# Patient Record
Sex: Male | Born: 1960 | Race: Black or African American | Hispanic: No | Marital: Single | Smoking: Current every day smoker
Health system: Southern US, Community
[De-identification: ages and names within clinical notes are randomized; demographics above are authoritative.]

## PROBLEM LIST (undated history)

## (undated) DIAGNOSIS — I219 Acute myocardial infarction, unspecified: Secondary | ICD-10-CM

## (undated) DIAGNOSIS — E119 Type 2 diabetes mellitus without complications: Secondary | ICD-10-CM

## (undated) DIAGNOSIS — I10 Essential (primary) hypertension: Secondary | ICD-10-CM

---

## 2004-01-27 ENCOUNTER — Emergency Department (HOSPITAL_COMMUNITY): Admission: EM | Admit: 2004-01-27 | Discharge: 2004-01-27 | Payer: Self-pay | Admitting: Emergency Medicine
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
## 2019-11-06 ENCOUNTER — Other Ambulatory Visit: Payer: Self-pay

## 2019-11-06 ENCOUNTER — Encounter (HOSPITAL_COMMUNITY): Payer: Self-pay | Admitting: Emergency Medicine

## 2019-11-06 ENCOUNTER — Emergency Department (HOSPITAL_COMMUNITY): Payer: No Typology Code available for payment source

## 2019-11-06 ENCOUNTER — Emergency Department (HOSPITAL_COMMUNITY)
Admission: EM | Admit: 2019-11-06 | Discharge: 2019-11-06 | Disposition: A | Discharge status: Home / Self Care | Payer: No Typology Code available for payment source | Attending: Emergency Medicine | Admitting: Emergency Medicine

## 2019-11-06 DIAGNOSIS — Y929 Unspecified place or not applicable: Secondary | ICD-10-CM | POA: Diagnosis not present

## 2019-11-06 DIAGNOSIS — S39012A Strain of muscle, fascia and tendon of lower back, initial encounter: Secondary | ICD-10-CM | POA: Insufficient documentation

## 2019-11-06 DIAGNOSIS — E119 Type 2 diabetes mellitus without complications: Secondary | ICD-10-CM | POA: Insufficient documentation

## 2019-11-06 DIAGNOSIS — M25511 Pain in right shoulder: Secondary | ICD-10-CM | POA: Insufficient documentation

## 2019-11-06 DIAGNOSIS — Y9389 Activity, other specified: Secondary | ICD-10-CM | POA: Diagnosis not present

## 2019-11-06 DIAGNOSIS — F17200 Nicotine dependence, unspecified, uncomplicated: Secondary | ICD-10-CM | POA: Insufficient documentation

## 2019-11-06 DIAGNOSIS — M79651 Pain in right thigh: Secondary | ICD-10-CM | POA: Diagnosis not present

## 2019-11-06 DIAGNOSIS — Y998 Other external cause status: Secondary | ICD-10-CM | POA: Diagnosis not present

## 2019-11-06 DIAGNOSIS — I10 Essential (primary) hypertension: Secondary | ICD-10-CM | POA: Insufficient documentation

## 2019-11-06 DIAGNOSIS — S3992XA Unspecified injury of lower back, initial encounter: Secondary | ICD-10-CM | POA: Diagnosis present

## 2019-11-06 HISTORY — DX: Type 2 diabetes mellitus without complications: E11.9

## 2019-11-06 HISTORY — DX: Essential (primary) hypertension: I10

## 2019-11-06 MED ORDER — ACETAMINOPHEN 500 MG PO TABS
1000.0000 mg | ORAL_TABLET | Freq: Once | ORAL | Status: AC
  Administered 2019-11-06: 1000 mg via ORAL

## 2019-11-06 MED ORDER — CYCLOBENZAPRINE HCL 10 MG PO TABS: 10.0000 mg | ORAL_TABLET | Freq: Two times a day (BID) | ORAL | 0 refills | Status: AC | PRN

## 2019-11-06 NOTE — Discharge Instructions (Signed)
Use heat and Flexeril as needed for muscle spasms. Continue Tylenol and ibuprofen as needed for pain. Return for new or worsening symptoms.
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
## 2020-06-28 ENCOUNTER — Emergency Department (HOSPITAL_COMMUNITY)
Admission: EM | Admit: 2020-06-28 | Discharge: 2020-06-28 | Disposition: A | Discharge status: Home / Self Care | Payer: Self-pay | Attending: Emergency Medicine | Admitting: Emergency Medicine

## 2020-06-28 ENCOUNTER — Encounter (HOSPITAL_COMMUNITY): Payer: Self-pay | Admitting: Emergency Medicine

## 2020-06-28 DIAGNOSIS — R4182 Altered mental status, unspecified: Secondary | ICD-10-CM | POA: Insufficient documentation

## 2020-06-28 DIAGNOSIS — Z5321 Procedure and treatment not carried out due to patient leaving prior to being seen by health care provider: Secondary | ICD-10-CM | POA: Insufficient documentation

## 2020-06-28 LAB — CBG MONITORING, ED: Glucose-Capillary: 269 mg/dL — ABNORMAL HIGH (ref 70–99)

## 2020-06-28 NOTE — ED Notes (Signed)
Refusing to have lab draw on triage.

## 2020-06-28 NOTE — ED Notes (Signed)
Pt now alert and oriented x 4, talking on phone with friend who he is asking to come pick him up.  Pt verbalizes understanding of risks associated with leaving prior to being evaluated by MD. Pt verbalizes understanding. IV removed and pt ambulatory out of dept.

## 2020-06-28 NOTE — ED Triage Notes (Signed)
[place] [facility] [place] side [place] where [facility] called due to patient acting altered, cbg was 241, ems reports pt ate some food and then started to become more drowsy and altered for ems. Pt did admit to ETOH. EMS GCS 6, 92-93% on room air, last cbg 268, hr 83, rr 18, 18G IV L hand.

## 2020-08-10 ENCOUNTER — Emergency Department (HOSPITAL_COMMUNITY)
Admission: EM | Admit: 2020-08-10 | Discharge: 2020-08-10 | Disposition: A | Discharge status: Home / Self Care | Payer: Self-pay | Attending: Emergency Medicine | Admitting: Emergency Medicine

## 2020-08-10 ENCOUNTER — Encounter (HOSPITAL_COMMUNITY): Payer: Self-pay | Admitting: Emergency Medicine

## 2020-08-10 DIAGNOSIS — R112 Nausea with vomiting, unspecified: Secondary | ICD-10-CM | POA: Insufficient documentation

## 2020-08-10 DIAGNOSIS — R42 Dizziness and giddiness: Secondary | ICD-10-CM

## 2020-08-10 DIAGNOSIS — Z5321 Procedure and treatment not carried out due to patient leaving prior to being seen by health care provider: Secondary | ICD-10-CM | POA: Insufficient documentation

## 2020-08-10 LAB — BASIC METABOLIC PANEL
Anion gap: 10 (ref 5–15)
BUN: 11 mg/dL (ref 6–20)
CO2: 22 mmol/L (ref 22–32)
Calcium: 9.1 mg/dL (ref 8.9–10.3)
Chloride: 106 mmol/L (ref 98–111)
Creatinine, Ser: 0.94 mg/dL (ref 0.61–1.24)
GFR, Estimated: >60 mL/min (ref >60)
Glucose, Bld: 285 mg/dL — ABNORMAL HIGH (ref 70–99)
Potassium: 4.2 mmol/L (ref 3.5–5.1)
Sodium: 138 mmol/L (ref 135–145)

## 2020-08-10 LAB — CBC
HCT: 47.6 % (ref 39.0–52.0)
Hemoglobin: 15.5 g/dL (ref 13.0–17.0)
MCH: 32.2 pg (ref 26.0–34.0)
MCHC: 32.6 g/dL (ref 30.0–36.0)
MCV: 98.8 fL (ref 80.0–100.0)
Platelets: 194 10*3/uL (ref 150–400)
RBC: 4.82 MIL/uL (ref 4.22–5.81)
RDW: 12.1 % (ref 11.5–15.5)
WBC: 7.2 10*3/uL (ref 4.0–10.5)
nRBC: 0 % (ref 0.0–0.2)

## 2020-08-10 LAB — CBG MONITORING, ED: Glucose-Capillary: 279 mg/dL — ABNORMAL HIGH (ref 70–99)

## 2020-08-10 NOTE — ED Notes (Signed)
Pt did not respond in the waiting room. Unable to find pt in triage, waiting room, bathroom etc.

## 2020-08-10 NOTE — ED Triage Notes (Signed)
Pt arrives to ED via [facility]- pt states he was walking from [place] to the [place] when around 9am he began to feel like the world was spinning this made him nausea and caused him to vomit a few times. Someone who seen this called ems. Pt states by the time ems arrived he felt much better no dizziness at this time. Pt has clear speech no arm or leg drift, clear speech. Pt also reports drinking liquor last night and does normally drink beer daily.

## 2020-08-10 NOTE — ED Notes (Signed)
Assumed pt eloped not able to find anywhere in [place]

## 2020-11-27 ENCOUNTER — Emergency Department (HOSPITAL_COMMUNITY)
Admission: EM | Admit: 2020-11-27 | Discharge: 2020-11-27 | Disposition: A | Discharge status: Home / Self Care | Payer: HRSA Program | Attending: Emergency Medicine | Admitting: Emergency Medicine

## 2020-11-27 ENCOUNTER — Other Ambulatory Visit: Payer: Self-pay

## 2020-11-27 DIAGNOSIS — R112 Nausea with vomiting, unspecified: Secondary | ICD-10-CM | POA: Insufficient documentation

## 2020-11-27 DIAGNOSIS — I10 Essential (primary) hypertension: Secondary | ICD-10-CM | POA: Insufficient documentation

## 2020-11-27 DIAGNOSIS — E1165 Type 2 diabetes mellitus with hyperglycemia: Secondary | ICD-10-CM | POA: Diagnosis not present

## 2020-11-27 DIAGNOSIS — R1033 Periumbilical pain: Secondary | ICD-10-CM | POA: Diagnosis not present

## 2020-11-27 DIAGNOSIS — R059 Cough, unspecified: Secondary | ICD-10-CM | POA: Diagnosis present

## 2020-11-27 DIAGNOSIS — F17200 Nicotine dependence, unspecified, uncomplicated: Secondary | ICD-10-CM | POA: Diagnosis not present

## 2020-11-27 DIAGNOSIS — U071 COVID-19: Secondary | ICD-10-CM | POA: Diagnosis not present

## 2020-11-27 DIAGNOSIS — R739 Hyperglycemia, unspecified: Secondary | ICD-10-CM

## 2020-11-27 LAB — CBC
HCT: 47.2 % (ref 39.0–52.0)
Hemoglobin: 16.1 g/dL (ref 13.0–17.0)
MCH: 32.1 pg (ref 26.0–34.0)
MCHC: 34.1 g/dL (ref 30.0–36.0)
MCV: 94 fL (ref 80.0–100.0)
Platelets: 239 10*3/uL (ref 150–400)
RBC: 5.02 MIL/uL (ref 4.22–5.81)
RDW: 12.1 % (ref 11.5–15.5)
WBC: 5.1 10*3/uL (ref 4.0–10.5)
nRBC: 0 % (ref 0.0–0.2)

## 2020-11-27 LAB — URINALYSIS, ROUTINE W REFLEX MICROSCOPIC
Bilirubin Urine: NEGATIVE
Glucose, UA: >=500 mg/dL — AB
Hgb urine dipstick: NEGATIVE
Ketones, ur: 20 mg/dL — AB
Leukocytes,Ua: NEGATIVE
Nitrite: NEGATIVE
Protein, ur: NEGATIVE mg/dL
Specific Gravity, Urine: 1.036 — ABNORMAL HIGH (ref 1.005–1.030)
pH: 5 (ref 5.0–8.0)

## 2020-11-27 LAB — COMPREHENSIVE METABOLIC PANEL
ALT: 31 U/L (ref 0–44)
AST: 22 U/L (ref 15–41)
Albumin: 3 g/dL — ABNORMAL LOW (ref 3.5–5.0)
Alkaline Phosphatase: 74 U/L (ref 38–126)
Anion gap: 11 (ref 5–15)
BUN: 10 mg/dL (ref 6–20)
CO2: 23 mmol/L (ref 22–32)
Calcium: 9.2 mg/dL (ref 8.9–10.3)
Chloride: 102 mmol/L (ref 98–111)
Creatinine, Ser: 0.92 mg/dL (ref 0.61–1.24)
GFR, Estimated: >60 mL/min (ref >60)
Glucose, Bld: 311 mg/dL — ABNORMAL HIGH (ref 70–99)
Potassium: 4 mmol/L (ref 3.5–5.1)
Sodium: 136 mmol/L (ref 135–145)
Total Bilirubin: 0.5 mg/dL (ref 0.3–1.2)
Total Protein: 6 g/dL — ABNORMAL LOW (ref 6.5–8.1)

## 2020-11-27 LAB — CBG MONITORING, ED: Glucose-Capillary: 300 mg/dL — ABNORMAL HIGH (ref 70–99)

## 2020-11-27 LAB — POC SARS CORONAVIRUS 2 AG -  ED: SARS Coronavirus 2 Ag: POSITIVE — AB

## 2020-11-27 LAB — LIPASE, BLOOD: Lipase: 48 U/L (ref 11–51)

## 2020-11-27 MED ORDER — INSULIN ASPART 100 UNIT/ML ~~LOC~~ SOLN
5.0000 [IU] | Freq: Once | SUBCUTANEOUS | Status: AC
  Administered 2020-11-27: 5 [IU] via SUBCUTANEOUS

## 2020-11-27 MED ORDER — IBUPROFEN 400 MG PO TABS
600.0000 mg | ORAL_TABLET | Freq: Once | ORAL | Status: AC
  Administered 2020-11-27: 600 mg via ORAL
  Filled 2020-11-27: qty 1

## 2020-11-27 MED ORDER — METFORMIN HCL 500 MG PO TABS
500.0000 mg | ORAL_TABLET | Freq: Once | ORAL | Status: AC
  Administered 2020-11-27: 500 mg via ORAL
  Filled 2020-11-27: qty 1

## 2020-11-27 MED ORDER — ONDANSETRON 4 MG PO TBDP: 4.0000 mg | ORAL_TABLET | Freq: Three times a day (TID) | ORAL | 0 refills | Status: AC | PRN

## 2020-11-27 MED ORDER — ONDANSETRON 4 MG PO TBDP
4.0000 mg | ORAL_TABLET | Freq: Once | ORAL | Status: AC
  Administered 2020-11-27: 4 mg via ORAL
  Filled 2020-11-27: qty 1

## 2020-11-27 NOTE — ED Notes (Signed)
Date and time results received: 11/27/20  Test: COVID  Critical Value: Positive  Name of Provider Notified: Tresa Endo PA

## 2020-11-27 NOTE — ED Triage Notes (Signed)
Pt reports two weeks of central abdominal pain, n/v/d.

## 2020-11-27 NOTE — Discharge Instructions (Signed)
You have tested positive for COVID-19 today. Take tylenol for fever, headaches, body aches. Drink plenty of fluids to prevent dehydration and use Zofran to manage persistent nausea. Remain compliant with your metformin and insulin. You may continue to use other over-the-counter remedies for symptom control, if desired. Return for new or concerning symptoms such as worsening shortness of breath, coughing up blood, persistent vomiting, loss of consciousness.

## 2020-11-27 NOTE — ED Notes (Signed)
Pt's CBG result was 300. Informed Morrie Sheldon - RN.

## 2020-11-27 NOTE — ED Provider Notes (Signed)
[facility] EMERGENCY DEPARTMENT Provider Note   CSN: [id number] Arrival date & time: 11/27/20  1448     History Chief Complaint  Patient presents with  . Abdominal Pain  . Vomiting  . Diarrhea    John Mayo is a 60 y.o. male.  60 year old male with hx of HTN, IDDM presents to the emergency department for 1 week of flulike symptoms. States that he has been experiencing generalized fatigue and malaise with cough and nasal congestion. Has developed nausea and vomiting with difficulty tolerating food and fluids; however, has only experienced one episode of vomiting in the past 24 hours. Does endorse some of his emesis to be posttussive. Has been trying over-the-counter medications, such as Alka-Seltzer and Tylenol, for symptomatic improvement without significant relief. Triage note references abdominal pain, though patient reporting only minimal waxing and waning periumbilical discomfort. He has no history of abdominal surgeries. Has been poorly compliant with his insulin due to lack of appetite. No polyuria or polydipsia. Not vaccinated for COVID.        Past Medical History:  Diagnosis Date  . Diabetes mellitus without complication ([place])   . Hypertension     There are no problems to display for this patient.   No past surgical history on file.     No family history on file.  Social History   Tobacco Use  . Smoking status: Current Every Day Smoker  . Smokeless tobacco: Never Used  Substance Use Topics  . Alcohol use: Yes  . Drug use: Not Currently    Home Medications Prior to Admission medications   Medication Sig Start Date End Date Taking? Authorizing Provider  ondansetron (ZOFRAN ODT) 4 MG disintegrating tablet Take 1 tablet (4 mg total) by mouth every 8 (eight) hours as needed for nausea or vomiting. 11/27/20  Yes Antonietta Breach, PA-C  cyclobenzaprine (FLEXERIL) 10 MG tablet Take 1 tablet (10 mg total) by mouth 2 (two) times daily as needed  for muscle spasms. 11/06/19   Elnora Morrison, MD    Allergies    Patient has no known allergies.  Review of Systems   Review of Systems  Constitutional: Positive for appetite change, chills and fever (subjective).  Respiratory: Positive for cough. Negative for shortness of breath.   Gastrointestinal: Positive for diarrhea and vomiting.  Musculoskeletal: Positive for myalgias.  Neurological: Negative for syncope.  Ten systems reviewed and are negative for acute change, except as noted in the HPI.    Physical Exam Updated Vital Signs BP 129/75   Pulse 65   Temp 99.2 F (37.3 C) (Oral)   Resp 16   Ht 5' 10"  (1.778 m)   Wt 98 kg   SpO2 100%   BMI 30.99 kg/m   Physical Exam Vitals and nursing note reviewed.  Constitutional:      General: He is not in acute distress.    Appearance: He is well-developed and well-nourished. He is not diaphoretic.     Comments: Appears fatigued, but nontoxic. Patient in no acute distress.  HENT:     Head: Normocephalic and atraumatic.     Nose: Congestion present.  Eyes:     General: No scleral icterus.    Extraocular Movements: EOM normal.     Conjunctiva/sclera: Conjunctivae normal.  Cardiovascular:     Rate and Rhythm: Normal rate and regular rhythm.     Pulses: Normal pulses.  Pulmonary:     Effort: Pulmonary effort is normal. No respiratory distress.     Breath  sounds: No stridor. No wheezing.     Comments: Sporadic, dry cough. Respirations even and unlabored. Sats 96% on room air. Abdominal:     General: There is no distension.     Palpations: Abdomen is soft.     Tenderness: There is no abdominal tenderness.     Comments: Soft, nontender abdomen. No masses, peritoneal signs.  Musculoskeletal:        General: Normal range of motion.     Cervical back: Normal range of motion.  Skin:    General: Skin is warm and dry.     Coloration: Skin is not pale.     Findings: No erythema or rash.  Neurological:     Mental Status: He is  alert and oriented to person, place, and time.     Coordination: Coordination normal.  Psychiatric:        Mood and Affect: Mood and affect normal.        Behavior: Behavior normal.     ED Results / Procedures / Treatments   Labs (all labs ordered are listed, but only abnormal results are displayed) Labs Reviewed  COMPREHENSIVE METABOLIC PANEL - Abnormal; Notable for the following components:      Result Value   Glucose, Bld 311 (*)    Total Protein 6.0 (*)    Albumin 3.0 (*)    All other components within normal limits  URINALYSIS, ROUTINE W REFLEX MICROSCOPIC - Abnormal; Notable for the following components:   Specific Gravity, Urine 1.036 (*)    Glucose, UA >=500 (*)    Ketones, ur 20 (*)    Bacteria, UA RARE (*)    All other components within normal limits  POC SARS CORONAVIRUS 2 AG -  ED - Abnormal; Notable for the following components:   SARS Coronavirus 2 Ag POSITIVE (*)    All other components within normal limits  LIPASE, BLOOD  CBC  CBG MONITORING, ED    EKG None  Radiology No results found.  Procedures Procedures   Medications Ordered in ED Medications  ondansetron (ZOFRAN-ODT) disintegrating tablet 4 mg (4 mg Oral Given 11/27/20 1121)  ibuprofen (ADVIL) tablet 600 mg (600 mg Oral Given 11/27/20 1121)  metFORMIN (GLUCOPHAGE) tablet 500 mg (500 mg Oral Given 11/27/20 1121)  insulin aspart (novoLOG) injection 5 Units (5 Units Subcutaneous Given 11/27/20 1149)    ED Course  I have reviewed the triage vital signs and the nursing notes.  Pertinent labs & imaging results that were available during my care of the patient were reviewed by me and considered in my medical decision making (see chart for details).    MDM Rules/Calculators/A&P                          60 year old male presenting to the emergency department for a myriad of symptoms including generalized fatigue, malaise, myalgias, cough, nausea and vomiting.  He tested positive for COVID-19 in the  ED today.  He is not vaccinated.  Has no shortness of breath, tachypnea, dyspnea, hypoxia.  Is afebrile with reassuring laboratory evaluation.  No vomiting since arrival to the ED.  Was given Zofran and has been able to tolerate oral medications.  Will discharge with instructions for continued outpatient supportive care.  Short course of antiemetics prescribed.  Return precautions discussed and provided. Patient discharged in stable condition with no unaddressed concerns.  Minas Bonser was evaluated in Emergency Department on 11/27/2020 for the symptoms described in the history of  present illness. He was evaluated in the context of the global COVID-19 pandemic, which necessitated consideration that the patient might be at risk for infection with the SARS-CoV-2 virus that causes COVID-19. Institutional protocols and algorithms that pertain to the evaluation of patients at risk for COVID-19 are in a state of rapid change based on information released by regulatory bodies including the CDC and federal and state organizations. These policies and algorithms were followed during the patient's care in the ED.   Final Clinical Impression(s) / ED Diagnoses Final diagnoses:  [id number] virus infection  Hyperglycemia    Rx / DC Orders ED Discharge Orders         Ordered    ondansetron (ZOFRAN ODT) 4 MG disintegrating tablet  Every 8 hours PRN        11/27/20 1237    Ambulatory referral for Covid Treatment        11/27/20 1237           Antonietta Breach, Hershal Coria 11/27/20 1245    Truddie Hidden, MD 11/27/20 1329

## 2020-11-27 NOTE — ED Notes (Signed)
Pt d/c home per MD order. Tresa Endo PA made aware of pt CBG. Discharge summary reviewed with pt, pt verbalizes understanding. Ambulatory off unit. No s/s of acute distress noted at discharge.
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
## 2020-12-30 ENCOUNTER — Encounter (HOSPITAL_COMMUNITY): Payer: Self-pay

## 2020-12-30 ENCOUNTER — Emergency Department (HOSPITAL_COMMUNITY)
Admission: EM | Admit: 2020-12-30 | Discharge: 2020-12-30 | Disposition: A | Discharge status: Home / Self Care | Payer: Self-pay | Attending: Emergency Medicine | Admitting: Emergency Medicine

## 2020-12-30 DIAGNOSIS — R42 Dizziness and giddiness: Secondary | ICD-10-CM | POA: Insufficient documentation

## 2020-12-30 DIAGNOSIS — I959 Hypotension, unspecified: Secondary | ICD-10-CM | POA: Insufficient documentation

## 2020-12-30 DIAGNOSIS — E119 Type 2 diabetes mellitus without complications: Secondary | ICD-10-CM | POA: Insufficient documentation

## 2020-12-30 DIAGNOSIS — I10 Essential (primary) hypertension: Secondary | ICD-10-CM | POA: Insufficient documentation

## 2020-12-30 DIAGNOSIS — R55 Syncope and collapse: Secondary | ICD-10-CM | POA: Insufficient documentation

## 2020-12-30 DIAGNOSIS — F17200 Nicotine dependence, unspecified, uncomplicated: Secondary | ICD-10-CM | POA: Insufficient documentation

## 2020-12-30 LAB — CBC WITH DIFFERENTIAL/PLATELET
Abs Immature Granulocytes: 0.1 10*3/uL — ABNORMAL HIGH (ref 0.00–0.07)
Basophils Absolute: 0 10*3/uL (ref 0.0–0.1)
Basophils Relative: 0 %
Eosinophils Absolute: 0.1 10*3/uL (ref 0.0–0.5)
Eosinophils Relative: 1 %
HCT: 55 % — ABNORMAL HIGH (ref 39.0–52.0)
Hemoglobin: 18.3 g/dL — ABNORMAL HIGH (ref 13.0–17.0)
Immature Granulocytes: 1 %
Lymphocytes Relative: 10 %
Lymphs Abs: 1.5 10*3/uL (ref 0.7–4.0)
MCH: 31.2 pg (ref 26.0–34.0)
MCHC: 33.3 g/dL (ref 30.0–36.0)
MCV: 93.7 fL (ref 80.0–100.0)
Monocytes Absolute: 0.8 10*3/uL (ref 0.1–1.0)
Monocytes Relative: 5 %
Neutro Abs: 11.9 10*3/uL — ABNORMAL HIGH (ref 1.7–7.7)
Neutrophils Relative %: 83 %
Platelets: 255 10*3/uL (ref 150–400)
RBC: 5.87 MIL/uL — ABNORMAL HIGH (ref 4.22–5.81)
RDW: 12.2 % (ref 11.5–15.5)
WBC: 14.4 10*3/uL — ABNORMAL HIGH (ref 4.0–10.5)
nRBC: 0 % (ref 0.0–0.2)

## 2020-12-30 LAB — BASIC METABOLIC PANEL
Anion gap: 10 (ref 5–15)
BUN: 15 mg/dL (ref 6–20)
CO2: 22 mmol/L (ref 22–32)
Calcium: 9.4 mg/dL (ref 8.9–10.3)
Chloride: 101 mmol/L (ref 98–111)
Creatinine, Ser: 0.97 mg/dL (ref 0.61–1.24)
GFR, Estimated: >60 mL/min (ref >60)
Glucose, Bld: 367 mg/dL — ABNORMAL HIGH (ref 70–99)
Potassium: 4.4 mmol/L (ref 3.5–5.1)
Sodium: 133 mmol/L — ABNORMAL LOW (ref 135–145)

## 2020-12-30 LAB — TROPONIN I (HIGH SENSITIVITY): Troponin I (High Sensitivity): 7 ng/L (ref <18)

## 2020-12-30 MED ORDER — SODIUM CHLORIDE 0.9 % IV BOLUS: 1000.0000 mL | Freq: Once | INTRAVENOUS | Status: DC

## 2020-12-30 NOTE — ED Notes (Signed)
EKG give to Dr.Hong

## 2020-12-30 NOTE — ED Provider Notes (Signed)
[facility] EMERGENCY DEPARTMENT Provider Note   CSN: [id number] Arrival date & time: 12/30/20  1327     History Chief Complaint  Patient presents with  . Dizziness  . Near Syncope  . Hypotension    John Mayo is a 60 y.o. male.  Patient presents with episode of lightheadedness dizziness after donating plasma this morning.  He states he did not eat any food this morning, went to donate plasma and afterwards felt lightheaded and dizzy and had to sit down.  Denies loss of consciousness.  Denies headache or chest pain.  Denies fever cough vomiting or diarrhea.  Currently he states he feels much better.        Past Medical History:  Diagnosis Date  . Diabetes mellitus without complication ([facility])   . Hypertension     There are no problems to display for this patient.   History reviewed. No pertinent surgical history.     History reviewed. No pertinent family history.  Social History   Tobacco Use  . Smoking status: Current Every Day Smoker  . Smokeless tobacco: Never Used  Substance Use Topics  . Alcohol use: Yes  . Drug use: Not Currently    Home Medications Prior to Admission medications   Medication Sig Start Date End Date Taking? Authorizing Provider  cyclobenzaprine (FLEXERIL) 10 MG tablet Take 1 tablet (10 mg total) by mouth 2 (two) times daily as needed for muscle spasms. 11/06/19   Blane Ohara, MD  ondansetron (ZOFRAN ODT) 4 MG disintegrating tablet Take 1 tablet (4 mg total) by mouth every 8 (eight) hours as needed for nausea or vomiting. 11/27/20   Antony Madura, PA-C    Allergies    Patient has no known allergies.  Review of Systems   Review of Systems  Constitutional: Negative for fever.  HENT: Negative for ear pain and sore throat.   Eyes: Negative for pain.  Respiratory: Negative for cough.   Cardiovascular: Negative for chest pain.  Gastrointestinal: Negative for abdominal pain.  Genitourinary: Negative for flank pain.   Musculoskeletal: Negative for back pain.  Skin: Negative for color change and rash.  Neurological: Negative for syncope.  All other systems reviewed and are negative.   Physical Exam Updated Vital Signs BP (!) 159/88   Pulse 82   Temp 98.7 F (37.1 C) (Oral)   Resp 15   SpO2 99%   Physical Exam Constitutional:      General: He is not in acute distress.    Appearance: He is well-developed.  HENT:     Head: Normocephalic.     Nose: Nose normal.  Eyes:     Extraocular Movements: Extraocular movements intact.  Cardiovascular:     Rate and Rhythm: Normal rate.  Pulmonary:     Effort: Pulmonary effort is normal.  Skin:    Coloration: Skin is not jaundiced.  Neurological:     General: No focal deficit present.     Mental Status: He is alert. Mental status is at baseline.     Cranial Nerves: No cranial nerve deficit.     Motor: No weakness.     Gait: Gait normal.     ED Results / Procedures / Treatments   Labs (all labs ordered are listed, but only abnormal results are displayed) Labs Reviewed  BASIC METABOLIC PANEL - Abnormal; Notable for the following components:      Result Value   Sodium 133 (*)    Glucose, Bld 367 (*)  All other components within normal limits  CBC WITH DIFFERENTIAL/PLATELET - Abnormal; Notable for the following components:   WBC 14.4 (*)    RBC 5.87 (*)    Hemoglobin 18.3 (*)    HCT 55.0 (*)    Neutro Abs 11.9 (*)    Abs Immature Granulocytes 0.10 (*)    All other components within normal limits  TROPONIN I (HIGH SENSITIVITY)  TROPONIN I (HIGH SENSITIVITY)    EKG EKG Interpretation  Date/Time:  Saturday December 30 2020 13:32:23 EST Ventricular Rate:  70 PR Interval:    QRS Duration: 82 QT Interval:  365 QTC Calculation: 394 R Axis:   92 Text Interpretation: Sinus rhythm Borderline right axis deviation ST elevation suggests acute pericarditis Confirmed by Norman Clay ([id number]) on 12/30/2020 2:10:19 PM   Radiology No results  found.  Procedures Procedures   Medications Ordered in ED Medications  sodium chloride 0.9 % bolus 1,000 mL (1,000 mLs Intravenous Patient Refused/Not Given 12/30/20 1501)    ED Course  I have reviewed the triage vital signs and the nursing notes.  Pertinent labs & imaging results that were available during my care of the patient were reviewed by me and considered in my medical decision making (see chart for details).    MDM Rules/Calculators/A&P                          Patient declined IV fluids.  EKG shows sinus rhythm, normal rate, J-point elevation in the anterolateral leads, similar to prior EKG in the past.  Troponins have been unremarkable.  Patient continues to wait in the ER with no additional adverse events no chest pain or discomfort.  Discharged home in stable condition advised continued fluid hydration at home.  Final Clinical Impression(s) / ED Diagnoses Final diagnoses:  Near syncope    Rx / DC Orders ED Discharge Orders    None       Cheryll Cockayne, MD 12/30/20 1527

## 2020-12-30 NOTE — ED Triage Notes (Signed)
BIB EMS for near syncopal event. Pt just finished donating plasma when he felt dizzy and sat on the ground. Pt reports he always donate plasma. Pt did not eat breakfast but blood sugar was 369. Hypotensive with EMS 98/42. Pt thinks he might have taken two BP meds this morning,

## 2020-12-30 NOTE — Discharge Instructions (Addendum)
Call your primary care doctor or specialist as discussed in the next 2-3 days.   Return immediately back to the ER if:  Your symptoms worsen within the next 12-24 hours. You develop new symptoms such as new fevers, persistent vomiting, new pain, shortness of breath, or new weakness or numbness, or if you have any other concerns.  

## 2022-05-07 ENCOUNTER — Other Ambulatory Visit: Payer: Self-pay

## 2022-05-07 ENCOUNTER — Emergency Department (HOSPITAL_COMMUNITY): Payer: Self-pay

## 2022-05-07 ENCOUNTER — Encounter (HOSPITAL_COMMUNITY): Payer: Self-pay

## 2022-05-07 ENCOUNTER — Emergency Department (HOSPITAL_COMMUNITY)
Admission: EM | Admit: 2022-05-07 | Discharge: 2022-05-08 | Disposition: A | Discharge status: Home / Self Care | Payer: Self-pay | Attending: Emergency Medicine | Admitting: Emergency Medicine

## 2022-05-07 DIAGNOSIS — Z79899 Other long term (current) drug therapy: Secondary | ICD-10-CM | POA: Insufficient documentation

## 2022-05-07 DIAGNOSIS — R739 Hyperglycemia, unspecified: Secondary | ICD-10-CM

## 2022-05-07 DIAGNOSIS — R1030 Lower abdominal pain, unspecified: Secondary | ICD-10-CM

## 2022-05-07 DIAGNOSIS — I10 Essential (primary) hypertension: Secondary | ICD-10-CM | POA: Insufficient documentation

## 2022-05-07 DIAGNOSIS — E1165 Type 2 diabetes mellitus with hyperglycemia: Secondary | ICD-10-CM | POA: Insufficient documentation

## 2022-05-07 DIAGNOSIS — Z7984 Long term (current) use of oral hypoglycemic drugs: Secondary | ICD-10-CM | POA: Insufficient documentation

## 2022-05-07 DIAGNOSIS — R531 Weakness: Secondary | ICD-10-CM | POA: Insufficient documentation

## 2022-05-07 LAB — COMPREHENSIVE METABOLIC PANEL
ALT: 26 U/L (ref 0–44)
AST: 26 U/L (ref 15–41)
Albumin: 3.6 g/dL (ref 3.5–5.0)
Alkaline Phosphatase: 75 U/L (ref 38–126)
Anion gap: 12 (ref 5–15)
BUN: 8 mg/dL (ref 6–20)
CO2: 21 mmol/L — ABNORMAL LOW (ref 22–32)
Calcium: 8.8 mg/dL — ABNORMAL LOW (ref 8.9–10.3)
Chloride: 101 mmol/L (ref 98–111)
Creatinine, Ser: 0.91 mg/dL (ref 0.61–1.24)
GFR, Estimated: >60 mL/min (ref >60)
Glucose, Bld: 293 mg/dL — ABNORMAL HIGH (ref 70–99)
Potassium: 3.7 mmol/L (ref 3.5–5.1)
Sodium: 134 mmol/L — ABNORMAL LOW (ref 135–145)
Total Bilirubin: 0.5 mg/dL (ref 0.3–1.2)
Total Protein: 6.5 g/dL (ref 6.5–8.1)

## 2022-05-07 LAB — URINALYSIS, ROUTINE W REFLEX MICROSCOPIC
Bilirubin Urine: NEGATIVE
Glucose, UA: 100 mg/dL — AB
Hgb urine dipstick: NEGATIVE
Ketones, ur: NEGATIVE mg/dL
Nitrite: NEGATIVE
Protein, ur: NEGATIVE mg/dL
Specific Gravity, Urine: 1.02 (ref 1.005–1.030)
pH: 5.5 (ref 5.0–8.0)

## 2022-05-07 LAB — CBC
HCT: 42.1 % (ref 39.0–52.0)
Hemoglobin: 13.8 g/dL (ref 13.0–17.0)
MCH: 30.9 pg (ref 26.0–34.0)
MCHC: 32.8 g/dL (ref 30.0–36.0)
MCV: 94.2 fL (ref 80.0–100.0)
Platelets: 175 10*3/uL (ref 150–400)
RBC: 4.47 MIL/uL (ref 4.22–5.81)
RDW: 13 % (ref 11.5–15.5)
WBC: 7.6 10*3/uL (ref 4.0–10.5)
nRBC: 0 % (ref 0.0–0.2)

## 2022-05-07 LAB — URINALYSIS, MICROSCOPIC (REFLEX)

## 2022-05-07 LAB — LIPASE, BLOOD: Lipase: 53 U/L — ABNORMAL HIGH (ref 11–51)

## 2022-05-07 MED ORDER — IOHEXOL 300 MG/ML  SOLN
100.0000 mL | Freq: Once | INTRAMUSCULAR | Status: AC | PRN
Start: 2022-05-07 — End: 2022-05-07
  Administered 2022-05-07: 100 mL via INTRAVENOUS

## 2022-05-07 NOTE — ED Provider Triage Note (Signed)
Emergency Medicine Provider Triage Evaluation Note  John Mayo , a 61 y.o. male with history of diabetes was evaluated in triage.  Pt complains of overall decreased energy and weakness x1 month.  He denies headache, chest pain, shortness of breath, nausea, vomiting and diarrhea.  He does endorse some intermittent abdominal pain for the last couple of weeks as well.  No previous history of strokes, denies cardiac history.  Review of Systems  Positive:  Negative:   Physical Exam  BP (!) 170/74   Pulse 81   Temp 98.3 F (36.8 C) (Oral)   Resp 18   Wt 97.5 kg   SpO2 93%   BMI 30.85 kg/m  Gen:   Awake, no distress, tired and slow to answer questions Resp:  Normal effort  MSK:   Moves extremities without difficulty  Other:  Tenderness to palpation around umbilical region.  Grip strength equal, good straight leg raise against resistance bilaterally.  No facial droop.  Subjective sensation intact  Medical Decision Making  Medically screening exam initiated at 9:08 PM.  Appropriate orders placed.  John Mayo was informed that the remainder of the evaluation will be completed by another provider, this initial triage assessment does not replace that evaluation, and the importance of remaining in the ED until their evaluation is complete.     Janell Quiet, [place] 05/07/22 2120

## 2022-05-07 NOTE — ED Triage Notes (Signed)
Pt reports "I dont feel good" reports feeling weak with lack of energy x 1 month. He reports abd pain x "a couple of weeks"

## 2022-05-08 ENCOUNTER — Encounter (HOSPITAL_COMMUNITY): Payer: Self-pay

## 2022-05-08 LAB — CBG MONITORING, ED: Glucose-Capillary: 217 mg/dL — ABNORMAL HIGH (ref 70–99)

## 2022-05-08 MED ORDER — METFORMIN HCL 500 MG PO TABS: 500.0000 mg | ORAL_TABLET | Freq: Two times a day (BID) | ORAL | 1 refills | Status: AC

## 2022-05-08 MED ORDER — AMLODIPINE BESYLATE 10 MG PO TABS: 10.0000 mg | ORAL_TABLET | Freq: Every day | ORAL | 0 refills | Status: AC

## 2022-05-08 NOTE — ED Provider Notes (Signed)
[facility] EMERGENCY DEPARTMENT Provider Note   CSN: [id number] Arrival date & time: 05/07/22  1945     History {Add pertinent medical, surgical, social history, OB history to HPI:1} Chief Complaint  Patient presents with   Weakness    John Mayo is a 61 y.o. male.  HPI     Presents with concern for weakness, don't feel good Has been feeling this way for about one month No energy, fatigue Abdominal pain for about 1 week. Lower abdomen. Nothing makes it better or worse, comes and goes, some radiation to right.   No pain with urination, no difficulty urinating Constipation sometimes, no diarrhea No fever No chest pain or dyspnea Cough sometimes  No new medications Glucose running a little high at home  Denies numbness, weakness, difficulty talking or walking, visual changes or facial droop.   No palpitations  Was seen 6/20 for groin swelling, then lost job day after that,   Past Medical History:  Diagnosis Date   Diabetes mellitus without complication (HCC)    Hypertension     Home Medications Prior to Admission medications   Medication Sig Start Date End Date Taking? Authorizing Provider  cyclobenzaprine (FLEXERIL) 10 MG tablet Take 1 tablet (10 mg total) by mouth 2 (two) times daily as needed for muscle spasms. 11/06/19   Blane Ohara, MD  ondansetron (ZOFRAN ODT) 4 MG disintegrating tablet Take 1 tablet (4 mg total) by mouth every 8 (eight) hours as needed for nausea or vomiting. 11/27/20   Antony Madura, PA-C      Allergies    Patient has no known allergies.    Review of Systems   Review of Systems  Physical Exam Updated Vital Signs BP (!) 173/74   Pulse (!) 51   Temp 98.3 F (36.8 C) (Oral)   Resp 16   Wt 97.5 kg   SpO2 95%   BMI 30.85 kg/m  Physical Exam Vitals and nursing note reviewed.  Constitutional:      General: He is not in acute distress.    Appearance: He is well-developed. He is not diaphoretic.  HENT:      Head: Normocephalic and atraumatic.  Eyes:     Conjunctiva/sclera: Conjunctivae normal.  Cardiovascular:     Rate and Rhythm: Normal rate and regular rhythm.     Heart sounds: Normal heart sounds. No murmur heard.    No friction rub. No gallop.  Pulmonary:     Effort: Pulmonary effort is normal. No respiratory distress.     Breath sounds: Normal breath sounds. No wheezing or rales.  Abdominal:     General: There is no distension.     Palpations: Abdomen is soft.     Tenderness: There is no abdominal tenderness. There is no guarding.  Musculoskeletal:     Cervical back: Normal range of motion.  Skin:    General: Skin is warm and dry.  Neurological:     Mental Status: He is alert and oriented to person, place, and time.     ED Results / Procedures / Treatments   Labs (all labs ordered are listed, but only abnormal results are displayed) Labs Reviewed  LIPASE, BLOOD - Abnormal; Notable for the following components:      Result Value   Lipase 53 (*)    All other components within normal limits  COMPREHENSIVE METABOLIC PANEL - Abnormal; Notable for the following components:   Sodium 134 (*)    CO2 21 (*)    Glucose,  Bld 293 (*)    Calcium 8.8 (*)    All other components within normal limits  URINALYSIS, ROUTINE W REFLEX MICROSCOPIC - Abnormal; Notable for the following components:   Glucose, UA 100 (*)    Leukocytes,Ua TRACE (*)    All other components within normal limits  URINALYSIS, MICROSCOPIC (REFLEX) - Abnormal; Notable for the following components:   Bacteria, UA RARE (*)    All other components within normal limits  CBG MONITORING, ED - Abnormal; Notable for the following components:   Glucose-Capillary 217 (*)    All other components within normal limits  CBC    EKG None  Radiology CT ABDOMEN PELVIS W CONTRAST  Result Date: 05/07/2022 CLINICAL DATA:  Abdomen pain right lower quadrant pain EXAM: CT ABDOMEN AND PELVIS WITH CONTRAST TECHNIQUE: Multidetector CT  imaging of the abdomen and pelvis was performed using the standard protocol following bolus administration of intravenous contrast. RADIATION DOSE REDUCTION: This exam was performed according to the departmental dose-optimization program which includes automated exposure control, adjustment of the mA and/or kV according to patient size and/or use of iterative reconstruction technique. CONTRAST:  OMNIPAQUE IOHEXOL 300 MG/ML  SOLN COMPARISON:  CT 04/16/2022 FINDINGS: Lower chest: No acute abnormality. Hepatobiliary: Hepatic steatosis. No calcified gallstone or biliary dilatation Pancreas: Unremarkable. No pancreatic ductal dilatation or surrounding inflammatory changes. Spleen: Normal in size without focal abnormality. Adrenals/Urinary Tract: Adrenal glands are unremarkable. Kidneys are normal, without renal calculi, focal lesion, or hydronephrosis. Bladder is unremarkable. Stomach/Bowel: Stomach is within normal limits. Appendix appears normal. No evidence of bowel wall thickening, distention, or inflammatory changes. Vascular/Lymphatic: Moderate aortic atherosclerosis. No aneurysm. No suspicious lymph nodes Reproductive: Prostate is unremarkable. Other: Negative for pelvic effusion or free air. Musculoskeletal: No acute osseous abnormality. IMPRESSION: 1. No CT evidence for acute intra-abdominal or pelvic abnormality. 2. Hepatic steatosis Electronically Signed   By: Jasmine Pang M.D.   On: 05/07/2022 23:57    Procedures Procedures  {Document cardiac monitor, telemetry assessment procedure when appropriate:1}  Medications Ordered in ED Medications  iohexol (OMNIPAQUE) 300 MG/ML solution 100 mL (100 mLs Intravenous Contrast Given 05/07/22 2340)    ED Course/ Medical Decision Making/ A&P                           Medical Decision Making Amount and/or Complexity of Data Reviewed Labs: ordered.   ***  {Document critical care time when appropriate:1} {Document review of labs and clinical  decision tools ie heart score, Chads2Vasc2 etc:1}  {Document your independent review of radiology images, and any outside records:1} {Document your discussion with family members, caretakers, and with consultants:1} {Document social determinants of health affecting pt's care:1} {Document your decision making why or why not admission, treatments were needed:1} Final Clinical Impression(s) / ED Diagnoses Final diagnoses:  None    Rx / DC Orders ED Discharge Orders     None

## 2022-05-08 NOTE — ED Notes (Signed)
Patient did come back in to wait.

## 2022-05-08 NOTE — ED Notes (Signed)
Patient left, because he said he had to go to work.

## 2022-05-18 ENCOUNTER — Emergency Department (HOSPITAL_COMMUNITY)
Admission: EM | Admit: 2022-05-18 | Discharge: 2022-05-18 | Disposition: A | Discharge status: Home / Self Care | Payer: Self-pay | Attending: Emergency Medicine | Admitting: Emergency Medicine

## 2022-05-18 ENCOUNTER — Emergency Department (HOSPITAL_COMMUNITY): Payer: Self-pay

## 2022-05-18 ENCOUNTER — Other Ambulatory Visit: Payer: Self-pay

## 2022-05-18 DIAGNOSIS — I10 Essential (primary) hypertension: Secondary | ICD-10-CM | POA: Insufficient documentation

## 2022-05-18 DIAGNOSIS — Z79899 Other long term (current) drug therapy: Secondary | ICD-10-CM | POA: Insufficient documentation

## 2022-05-18 DIAGNOSIS — R079 Chest pain, unspecified: Secondary | ICD-10-CM

## 2022-05-18 DIAGNOSIS — E119 Type 2 diabetes mellitus without complications: Secondary | ICD-10-CM | POA: Insufficient documentation

## 2022-05-18 DIAGNOSIS — Z7984 Long term (current) use of oral hypoglycemic drugs: Secondary | ICD-10-CM | POA: Insufficient documentation

## 2022-05-18 DIAGNOSIS — F17200 Nicotine dependence, unspecified, uncomplicated: Secondary | ICD-10-CM | POA: Insufficient documentation

## 2022-05-18 DIAGNOSIS — R0789 Other chest pain: Secondary | ICD-10-CM | POA: Insufficient documentation

## 2022-05-18 LAB — CBC WITH DIFFERENTIAL/PLATELET
Abs Immature Granulocytes: 0.03 10*3/uL (ref 0.00–0.07)
Basophils Absolute: 0 10*3/uL (ref 0.0–0.1)
Basophils Relative: 0 %
Eosinophils Absolute: 0 10*3/uL (ref 0.0–0.5)
Eosinophils Relative: 1 %
HCT: 42.3 % (ref 39.0–52.0)
Hemoglobin: 14.2 g/dL (ref 13.0–17.0)
Immature Granulocytes: 0 %
Lymphocytes Relative: 34 %
Lymphs Abs: 2.5 10*3/uL (ref 0.7–4.0)
MCH: 31.8 pg (ref 26.0–34.0)
MCHC: 33.6 g/dL (ref 30.0–36.0)
MCV: 94.6 fL (ref 80.0–100.0)
Monocytes Absolute: 0.4 10*3/uL (ref 0.1–1.0)
Monocytes Relative: 6 %
Neutro Abs: 4.3 10*3/uL (ref 1.7–7.7)
Neutrophils Relative %: 59 %
Platelets: 166 10*3/uL (ref 150–400)
RBC: 4.47 MIL/uL (ref 4.22–5.81)
RDW: 13.5 % (ref 11.5–15.5)
WBC: 7.4 10*3/uL (ref 4.0–10.5)
nRBC: 0 % (ref 0.0–0.2)

## 2022-05-18 LAB — COMPREHENSIVE METABOLIC PANEL
ALT: 28 U/L (ref 0–44)
AST: 25 U/L (ref 15–41)
Albumin: 3.5 g/dL (ref 3.5–5.0)
Alkaline Phosphatase: 59 U/L (ref 38–126)
Anion gap: 11 (ref 5–15)
BUN: 9 mg/dL (ref 6–20)
CO2: 21 mmol/L — ABNORMAL LOW (ref 22–32)
Calcium: 8.7 mg/dL — ABNORMAL LOW (ref 8.9–10.3)
Chloride: 104 mmol/L (ref 98–111)
Creatinine, Ser: 1.09 mg/dL (ref 0.61–1.24)
GFR, Estimated: >60 mL/min (ref >60)
Glucose, Bld: 179 mg/dL — ABNORMAL HIGH (ref 70–99)
Potassium: 3.8 mmol/L (ref 3.5–5.1)
Sodium: 136 mmol/L (ref 135–145)
Total Bilirubin: 0.6 mg/dL (ref 0.3–1.2)
Total Protein: 6.3 g/dL — ABNORMAL LOW (ref 6.5–8.1)

## 2022-05-18 LAB — TROPONIN I (HIGH SENSITIVITY)
Troponin I (High Sensitivity): 12 ng/L (ref <18)
Troponin I (High Sensitivity): 10 ng/L (ref <18)

## 2022-05-18 LAB — D-DIMER, QUANTITATIVE: D-Dimer, Quant: <0.27 ug/mL-FEU (ref 0.00–0.50)

## 2022-05-18 NOTE — ED Provider Notes (Signed)
[facility] EMERGENCY DEPARTMENT Provider Note   CSN: [id number] Arrival date & time: 05/18/22  0007     History  Chief Complaint  Patient presents with   Chest Pain    John Mayo is a 61 y.o. male.  61 year old male that presents the ER today secondary to chest pain.  Patient states he was at the bus stop and had a cute onset of right-sided and central chest pain.  Sharp in nature.  Patient states that it radiated up to both arms.  Was associate with some shortness of breath.  No nausea, vomiting, diaphoresis or lightheadedness.  Not had pain like this before has a history of smoking, hypertension diabetes.  No family history or personal history of heart disease.  No lower extremity swelling.  Has not been eating and drinking like he normally would but does drink alcohol pretty much daily.  Not compliant with his home medications.  No fever or cough.   Chest Pain      Home Medications Prior to Admission medications   Medication Sig Start Date End Date Taking? Authorizing Provider  amLODipine (NORVASC) 10 MG tablet Take 1 tablet (10 mg total) by mouth daily. 05/08/22 06/07/22  Alvira Monday, MD  cyclobenzaprine (FLEXERIL) 10 MG tablet Take 1 tablet (10 mg total) by mouth 2 (two) times daily as needed for muscle spasms. 11/06/19   Blane Ohara, MD  metFORMIN (GLUCOPHAGE) 500 MG tablet Take 1 tablet (500 mg total) by mouth 2 (two) times daily with a meal. 05/08/22 07/07/22  Alvira Monday, MD  ondansetron (ZOFRAN ODT) 4 MG disintegrating tablet Take 1 tablet (4 mg total) by mouth every 8 (eight) hours as needed for nausea or vomiting. 11/27/20   Antony Madura, PA-C      Allergies    Patient has no known allergies.    Review of Systems   Review of Systems  Cardiovascular:  Positive for chest pain.    Physical Exam Updated Vital Signs BP 129/71   Pulse 66   Temp 97.9 F (36.6 C) (Oral)   Resp 17   Ht 5\' 10"  (1.778 m)   Wt 95.3 kg   SpO2 96%   BMI  30.13 kg/m  Physical Exam Vitals and nursing note reviewed.  Constitutional:      Appearance: He is well-developed.  HENT:     Head: Normocephalic and atraumatic.  Cardiovascular:     Rate and Rhythm: Normal rate.  Pulmonary:     Effort: Pulmonary effort is normal. No respiratory distress.  Abdominal:     General: There is no distension.  Musculoskeletal:        General: Normal range of motion.     Cervical back: Normal range of motion.     Right lower leg: No edema.     Left lower leg: No edema.  Neurological:     Mental Status: He is alert.     ED Results / Procedures / Treatments   Labs (all labs ordered are listed, but only abnormal results are displayed) Labs Reviewed  COMPREHENSIVE METABOLIC PANEL - Abnormal; Notable for the following components:      Result Value   CO2 21 (*)    Glucose, Bld 179 (*)    Calcium 8.7 (*)    Total Protein 6.3 (*)    All other components within normal limits  CBC WITH DIFFERENTIAL/PLATELET  D-DIMER, QUANTITATIVE  TROPONIN I (HIGH SENSITIVITY)  TROPONIN I (HIGH SENSITIVITY)    EKG EKG Interpretation  Date/Time:  Saturday May 18 2022 00:12:30 EDT Ventricular Rate:  76 PR Interval:  167 QRS Duration: 81 QT Interval:  372 QTC Calculation: 419 R Axis:   78 Text Interpretation: Sinus rhythm Atrial premature complexes in couplets Lateral infarct, acute (LAD) Borderline ST elevation, anterior leads ST elevations in lateral leads more pronounced than 2022/03/21Confirmed by Marily Memos [id number]) on 05/18/2022 12:15:49 AM  Radiology DG Chest Portable 1 View  Result Date: 05/18/2022 CLINICAL DATA:  Chest pain EXAM: PORTABLE CHEST 1 VIEW COMPARISON:  01/30/2010 FINDINGS: Lungs are clear.  No pleural effusion or pneumothorax. The heart is normal in size. IMPRESSION: No evidence of acute cardiopulmonary disease. Electronically Signed   By: Charline Bills M.D.   On: 05/18/2022 00:51    Procedures Procedures    Medications Ordered  in ED Medications - No data to display  ED Course/ Medical Decision Making/ A&P                           Medical Decision Making Amount and/or Complexity of Data Reviewed Labs: ordered. Radiology: ordered.  ECG is abnormal in the lateral leads with some mild elevation.  He has some elevation on previous EKGs PsoriGel to cardiology as I do not think it represents acute coronary syndrome however he does have chest pain that radiates to both arms with some risk factors his hear score is 4 (without troponins).  D/w Dr. Welton Flakes, not too worried about changes compared to previous. Recommends troponins and if normal and symptom free can get outpatient workup.   Troponin reassuring. Pain free. Pending second trop.   Second troponin reassuring. Will d/c to follow up with PCP for further workup/referrals as needed.   Final Clinical Impression(s) / ED Diagnoses Final diagnoses:  Nonspecific chest pain    Rx / DC Orders ED Discharge Orders     None         Aliahna Statzer, Barbara Cower, MD 05/18/22 [phone]

## 2022-05-18 NOTE — ED Triage Notes (Signed)
Pt BIB EMS for right sided chest pain that goes into his arms. Pt is sharp and intermittent. 1 nito and 2 baby aspirin given PTA. Nitro took BP from 137 SBP to 97 SBP 250cc NS given pta.   CBG 162  137 SBP   97 SBP  100% RA

## 2022-05-18 NOTE — ED Notes (Signed)
Patient verbalizes understanding of discharge instructions. Opportunity for questioning and answers were provided. Armband removed by staff, pt discharged from ED. Ambulated out to lobby  

## 2024-01-04 ENCOUNTER — Encounter (HOSPITAL_COMMUNITY): Payer: Self-pay

## 2024-01-04 ENCOUNTER — Emergency Department (HOSPITAL_COMMUNITY): Payer: MEDICAID

## 2024-01-04 ENCOUNTER — Emergency Department (HOSPITAL_COMMUNITY)
Admission: EM | Admit: 2024-01-04 | Discharge: 2024-01-04 | Disposition: A | Discharge status: Home / Self Care | Payer: MEDICAID | Attending: Emergency Medicine | Admitting: Emergency Medicine

## 2024-01-04 ENCOUNTER — Other Ambulatory Visit: Payer: Self-pay

## 2024-01-04 DIAGNOSIS — I10 Essential (primary) hypertension: Secondary | ICD-10-CM | POA: Diagnosis not present

## 2024-01-04 DIAGNOSIS — R0789 Other chest pain: Secondary | ICD-10-CM | POA: Insufficient documentation

## 2024-01-04 DIAGNOSIS — R079 Chest pain, unspecified: Secondary | ICD-10-CM | POA: Diagnosis present

## 2024-01-04 DIAGNOSIS — Z79899 Other long term (current) drug therapy: Secondary | ICD-10-CM | POA: Diagnosis not present

## 2024-01-04 HISTORY — DX: Acute myocardial infarction, unspecified: I21.9

## 2024-01-04 LAB — BASIC METABOLIC PANEL
Anion gap: 12 (ref 5–15)
BUN: 17 mg/dL (ref 8–23)
CO2: 22 mmol/L (ref 22–32)
Calcium: 9.2 mg/dL (ref 8.9–10.3)
Chloride: 107 mmol/L (ref 98–111)
Creatinine, Ser: 1.08 mg/dL (ref 0.61–1.24)
GFR, Estimated: >60 mL/min (ref >60)
Glucose, Bld: 130 mg/dL — ABNORMAL HIGH (ref 70–99)
Potassium: 4.1 mmol/L (ref 3.5–5.1)
Sodium: 141 mmol/L (ref 135–145)

## 2024-01-04 LAB — CBC
HCT: 43.1 % (ref 39.0–52.0)
Hemoglobin: 14.5 g/dL (ref 13.0–17.0)
MCH: 32 pg (ref 26.0–34.0)
MCHC: 33.6 g/dL (ref 30.0–36.0)
MCV: 95.1 fL (ref 80.0–100.0)
Platelets: 213 10*3/uL (ref 150–400)
RBC: 4.53 MIL/uL (ref 4.22–5.81)
RDW: 13.2 % (ref 11.5–15.5)
WBC: 9.1 10*3/uL (ref 4.0–10.5)
nRBC: 0 % (ref 0.0–0.2)

## 2024-01-04 LAB — RESP PANEL BY RT-PCR (RSV, FLU A&B, COVID)  RVPGX2
Influenza A by PCR: NEGATIVE
Influenza B by PCR: NEGATIVE
Resp Syncytial Virus by PCR: NEGATIVE
SARS Coronavirus 2 by RT PCR: NEGATIVE

## 2024-01-04 LAB — TROPONIN I (HIGH SENSITIVITY)
Troponin I (High Sensitivity): 31 ng/L — ABNORMAL HIGH (ref <18)
Troponin I (High Sensitivity): 22 ng/L — ABNORMAL HIGH (ref <18)

## 2024-01-04 MED ORDER — LIDOCAINE VISCOUS HCL 2 % MT SOLN
15.0000 mL | Freq: Once | OROMUCOSAL | Status: AC
  Administered 2024-01-04: 15 mL via ORAL
  Filled 2024-01-04: qty 15

## 2024-01-04 MED ORDER — ALUM & MAG HYDROXIDE-SIMETH 200-200-20 MG/5ML PO SUSP
30.0000 mL | Freq: Once | ORAL | Status: AC
  Administered 2024-01-04: 30 mL via ORAL
  Filled 2024-01-04: qty 30

## 2024-01-04 NOTE — ED Provider Notes (Signed)
 [place] EMERGENCY DEPARTMENT AT [facility] Provider Note   CSN: [id number] Arrival date & time: 01/04/24  0417     History  Chief Complaint  Patient presents with   Chest Pain    Peterson Mathey is a 63 y.o. male.  63 year old male presents today for concern of chest pain that has been ongoing for the past several days.  Denies history of heart attack.  He does have history of hypertension.  Currently not on medicine.  Currently chest pain-free.  Denies other complaints.  The history is provided by the patient. No language interpreter was used.       Home Medications Prior to Admission medications   Medication Sig Start Date End Date Taking? Authorizing Provider  amLODipine (NORVASC) 10 MG tablet Take 1 tablet (10 mg total) by mouth daily. 05/08/22 06/07/22  Alvira Monday, MD  cyclobenzaprine (FLEXERIL) 10 MG tablet Take 1 tablet (10 mg total) by mouth 2 (two) times daily as needed for muscle spasms. 11/06/19   Blane Ohara, MD  metFORMIN (GLUCOPHAGE) 500 MG tablet Take 1 tablet (500 mg total) by mouth 2 (two) times daily with a meal. 05/08/22 07/07/22  Alvira Monday, MD  ondansetron (ZOFRAN ODT) 4 MG disintegrating tablet Take 1 tablet (4 mg total) by mouth every 8 (eight) hours as needed for nausea or vomiting. 11/27/20   Antony Madura, PA-C      Allergies    Patient has no known allergies.    Review of Systems   Review of Systems  Constitutional:  Negative for chills and fever.  Respiratory:  Negative for shortness of breath.   Cardiovascular:  Positive for chest pain. Negative for palpitations and leg swelling.  Neurological:  Negative for light-headedness.  All other systems reviewed and are negative.   Physical Exam Updated Vital Signs BP (!) 155/69   Pulse (!) 56   Temp 97.6 F (36.4 C) (Oral)   Resp 15   Ht 5\' 10"  (1.778 m)   Wt 95.3 kg   SpO2 99%   BMI 30.13 kg/m  Physical Exam Vitals and nursing note reviewed.  Constitutional:       General: He is not in acute distress.    Appearance: Normal appearance. He is not ill-appearing.  HENT:     Head: Normocephalic and atraumatic.     Nose: Nose normal.  Eyes:     Conjunctiva/sclera: Conjunctivae normal.  Cardiovascular:     Rate and Rhythm: Normal rate and regular rhythm.  Pulmonary:     Effort: Pulmonary effort is normal. No respiratory distress.     Breath sounds: Normal breath sounds.  Musculoskeletal:        General: No deformity. Normal range of motion.     Cervical back: Normal range of motion.  Skin:    Findings: No rash.  Neurological:     Mental Status: He is alert.     ED Results / Procedures / Treatments   Labs (all labs ordered are listed, but only abnormal results are displayed) Labs Reviewed  BASIC METABOLIC PANEL - Abnormal; Notable for the following components:      Result Value   Glucose, Bld 130 (*)    All other components within normal limits  TROPONIN I (HIGH SENSITIVITY) - Abnormal; Notable for the following components:   Troponin I (High Sensitivity) 31 (*)    All other components within normal limits  TROPONIN I (HIGH SENSITIVITY) - Abnormal; Notable for the following components:   Troponin I (High  Sensitivity) 22 (*)    All other components within normal limits  RESP PANEL BY RT-PCR (RSV, FLU A&B, COVID)  RVPGX2  CBC    EKG EKG Interpretation Date/Time:  Sunday January 04 2024 08:57:32 EDT Ventricular Rate:  62 PR Interval:  165 QRS Duration:  87 QT Interval:  394 QTC Calculation: 401 R Axis:   90  Text Interpretation: Sinus rhythm Atrial premature complex Prominent P waves, nondiagnostic Borderline right axis deviation ST elevation suggests acute pericarditis No significant change since last tracing Confirmed by Elayne Snare ([id number]) on 01/04/2024 9:39:22 AM  Radiology DG Chest 2 View Result Date: 01/04/2024 CLINICAL DATA:  63 year old male with chest pain. EXAM: CHEST - 2 VIEW COMPARISON:  Chest radiographs 11/10/2023 and  earlier. FINDINGS: PA and lateral views 0441 hours. Lung volumes and mediastinal contours remain normal. Visualized tracheal air column is within normal limits. Lung markings appear stable and within normal limits. Both lungs appear clear. No pneumothorax or pleural effusion. No acute osseous abnormality identified. Negative visible bowel gas. IMPRESSION: Negative.  No acute cardiopulmonary abnormality. Electronically Signed   By: Odessa Fleming M.D.   On: 01/04/2024 04:52    Procedures Procedures    Medications Ordered in ED Medications  alum & mag hydroxide-simeth (MAALOX/MYLANTA) 200-200-20 MG/5ML suspension 30 mL (30 mLs Oral Given 01/04/24 1032)    And  lidocaine (XYLOCAINE) 2 % viscous mouth solution 15 mL (15 mLs Oral Given 01/04/24 1032)    ED Course/ Medical Decision Making/ A&P                                 Medical Decision Making Amount and/or Complexity of Data Reviewed Labs: ordered. Radiology: ordered.  Risk OTC drugs. Prescription drug management.   Medical Decision Making / ED Course   This patient presents to the ED for concern of chest pain, this involves an extensive number of treatment options, and is a complaint that carries with it a high risk of complications and morbidity.  The differential diagnosis includes ACS, PE, pneumonia, MSK pain, GERD  MDM: 63 year old male presents today for concern of chest pain that has been ongoing for the past several days.  No prior history of CAD.  Does have history of hypertension but not on any medications.  Admission considered but will reevaluate after labs and imaging.  CBC unremarkable, BMP with glucose of 130 otherwise without acute concern.  Respiratory panel negative.  Initial troponin 31 and repeat downtrend to 22.  EKG without acute ischemic change.  Not consistent with ACS.  Will give patient cardiology referral.  He is chest pain-free in the emergency department.  Will discharge.  He is stable for discharge.   Discharged in stable condition.  Patient voices understanding and is in agreement with plan.  Lab Tests: -I ordered, reviewed, and interpreted labs.   The pertinent results include:   Labs Reviewed  BASIC METABOLIC PANEL - Abnormal; Notable for the following components:      Result Value   Glucose, Bld 130 (*)    All other components within normal limits  TROPONIN I (HIGH SENSITIVITY) - Abnormal; Notable for the following components:   Troponin I (High Sensitivity) 31 (*)    All other components within normal limits  TROPONIN I (HIGH SENSITIVITY) - Abnormal; Notable for the following components:   Troponin I (High Sensitivity) 22 (*)    All other components within normal limits  RESP PANEL  BY RT-PCR (RSV, FLU A&B, COVID)  RVPGX2  CBC      EKG  EKG Interpretation Date/Time:  Sunday January 04 2024 08:57:32 EDT Ventricular Rate:  62 PR Interval:  165 QRS Duration:  87 QT Interval:  394 QTC Calculation: 401 R Axis:   90  Text Interpretation: Sinus rhythm Atrial premature complex Prominent P waves, nondiagnostic Borderline right axis deviation ST elevation suggests acute pericarditis No significant change since last tracing Confirmed by Elayne Snare ([id number]) on 01/04/2024 9:39:22 AM         Imaging Studies ordered: I ordered imaging studies including chest x-ray I independently visualized and interpreted imaging. I agree with the radiologist interpretation   Medicines ordered and prescription drug management: Meds ordered this encounter  Medications   AND Linked Order Group    alum & mag hydroxide-simeth (MAALOX/MYLANTA) 200-200-20 MG/5ML suspension 30 mL    lidocaine (XYLOCAINE) 2 % viscous mouth solution 15 mL    -I have reviewed the patients home medicines and have made adjustments as needed   Reevaluation: After the interventions noted above, I reevaluated the patient and found that they have :improved  Co morbidities that complicate the patient  evaluation  Past Medical History:  Diagnosis Date   Diabetes mellitus without complication (HCC)    Hypertension    MI (myocardial infarction) (HCC)       Dispostion: Discharged in stable condition.  Return precaution discussed.  Patient voices understanding and is in agreement with the plan.   Final Clinical Impression(s) / ED Diagnoses Final diagnoses:  Atypical chest pain    Rx / DC Orders ED Discharge Orders          Ordered    Ambulatory referral to Cardiology       Comments: If you have not heard from the Cardiology office within the next 72 hours please call [phone].   01/04/24 1215              Marita Kansas, PA-C 01/04/24 1231    Theresia Lo, [place] K, DO 01/04/24 1534

## 2024-01-04 NOTE — Discharge Instructions (Signed)
 Your workup was reassuring.  No concern for heart attack or other concerning findings regarding your chest pain.  Please follow-up with a cardiologist.  They will call to schedule your appointment if you do not hear from them you can call the office to schedule this appointment.  Follow-up with your primary care provider.  Return for any emergent symptoms.

## 2024-01-04 NOTE — ED Provider Triage Note (Signed)
 Emergency Medicine Provider Triage Evaluation Note  John Mayo , a 63 y.o. male  was evaluated in triage.  Pt complains of chest pain.  Chest pain, headache and cough for several days..  Review of Systems  Positive: Chest pain, headache, cough Negative: Difficulty breathing  Physical Exam  BP (!) 148/72   Pulse 79   Temp 97.9 F (36.6 C) (Oral)   Resp 18   Ht 5\' 10"  (1.778 m)   Wt 95.3 kg   SpO2 99%   BMI 30.13 kg/m  Gen:   Awake, no distress   Resp:  Normal effort  MSK:   Moves extremities without difficulty      Medical Decision Making  Medically screening exam initiated at 5:02 AM.  Appropriate orders placed.  John Mayo was informed that the remainder of the evaluation will be completed by another provider, this initial triage assessment does not replace that evaluation, and the importance of remaining in the ED until their evaluation is complete.     Tilden Fossa, MD 01/04/24 [phone]

## 2024-01-04 NOTE — ED Triage Notes (Signed)
 L non radiating chest pain starting a few days ago intermittently. 8/10 initially. 7/10 after 1 nitroglycerin. 324mg  aspirin given. VSS. EKG NSR. Hx of MI 6 months ago, no stent placed. 147/68, HR 84, 96% RA, CBG 181

## 2024-01-06 ENCOUNTER — Telehealth: Payer: Self-pay | Admitting: *Deleted

## 2024-01-06 NOTE — Telephone Encounter (Signed)
 Pt called regarding getting a copy of after visit summary.  RNCM advised pt to enroll in MyChart to have on hand and get it quicker but pt prefers a hand copy.  RNCM printed AVS and left at from desk for pt to pick up at his leisure.

## 2024-01-15 ENCOUNTER — Emergency Department (HOSPITAL_COMMUNITY)
Admission: EM | Admit: 2024-01-15 | Discharge: 2024-01-15 | Disposition: A | Discharge status: Home / Self Care | Payer: MEDICAID | Attending: Emergency Medicine | Admitting: Emergency Medicine

## 2024-01-15 ENCOUNTER — Encounter (HOSPITAL_COMMUNITY): Payer: Self-pay | Admitting: Emergency Medicine

## 2024-01-15 ENCOUNTER — Encounter (HOSPITAL_COMMUNITY): Admission: EM | Payer: Self-pay

## 2024-01-15 ENCOUNTER — Ambulatory Visit (HOSPITAL_COMMUNITY): Admission: EM | Admit: 2024-01-15 | Payer: MEDICAID | Admitting: Cardiovascular Disease

## 2024-01-15 ENCOUNTER — Other Ambulatory Visit: Payer: Self-pay

## 2024-01-15 ENCOUNTER — Emergency Department (HOSPITAL_COMMUNITY): Payer: MEDICAID

## 2024-01-15 DIAGNOSIS — R42 Dizziness and giddiness: Secondary | ICD-10-CM | POA: Insufficient documentation

## 2024-01-15 DIAGNOSIS — I10 Essential (primary) hypertension: Secondary | ICD-10-CM | POA: Insufficient documentation

## 2024-01-15 DIAGNOSIS — E860 Dehydration: Secondary | ICD-10-CM | POA: Diagnosis not present

## 2024-01-15 DIAGNOSIS — E119 Type 2 diabetes mellitus without complications: Secondary | ICD-10-CM | POA: Insufficient documentation

## 2024-01-15 DIAGNOSIS — R072 Precordial pain: Secondary | ICD-10-CM | POA: Insufficient documentation

## 2024-01-15 DIAGNOSIS — M6282 Rhabdomyolysis: Secondary | ICD-10-CM | POA: Insufficient documentation

## 2024-01-15 DIAGNOSIS — Z79899 Other long term (current) drug therapy: Secondary | ICD-10-CM | POA: Insufficient documentation

## 2024-01-15 DIAGNOSIS — R0789 Other chest pain: Secondary | ICD-10-CM

## 2024-01-15 DIAGNOSIS — Z7984 Long term (current) use of oral hypoglycemic drugs: Secondary | ICD-10-CM | POA: Diagnosis not present

## 2024-01-15 LAB — COMPREHENSIVE METABOLIC PANEL
ALT: 36 U/L (ref 0–44)
AST: 66 U/L — ABNORMAL HIGH (ref 15–41)
Albumin: 3.7 g/dL (ref 3.5–5.0)
Alkaline Phosphatase: 55 U/L (ref 38–126)
Anion gap: 11 (ref 5–15)
BUN: 10 mg/dL (ref 8–23)
CO2: 18 mmol/L — ABNORMAL LOW (ref 22–32)
Calcium: 8.7 mg/dL — ABNORMAL LOW (ref 8.9–10.3)
Chloride: 108 mmol/L (ref 98–111)
Creatinine, Ser: 0.91 mg/dL (ref 0.61–1.24)
GFR, Estimated: >60 mL/min (ref >60)
Glucose, Bld: 107 mg/dL — ABNORMAL HIGH (ref 70–99)
Potassium: 3.8 mmol/L (ref 3.5–5.1)
Sodium: 137 mmol/L (ref 135–145)
Total Bilirubin: 0.7 mg/dL (ref 0.0–1.2)
Total Protein: 6.5 g/dL (ref 6.5–8.1)

## 2024-01-15 LAB — CBC WITH DIFFERENTIAL/PLATELET
Abs Immature Granulocytes: 0.03 10*3/uL (ref 0.00–0.07)
Basophils Absolute: 0.1 10*3/uL (ref 0.0–0.1)
Basophils Relative: 1 %
Eosinophils Absolute: 0.1 10*3/uL (ref 0.0–0.5)
Eosinophils Relative: 1 %
HCT: 43.9 % (ref 39.0–52.0)
Hemoglobin: 14.6 g/dL (ref 13.0–17.0)
Immature Granulocytes: 0 %
Lymphocytes Relative: 27 %
Lymphs Abs: 2.5 10*3/uL (ref 0.7–4.0)
MCH: 32 pg (ref 26.0–34.0)
MCHC: 33.3 g/dL (ref 30.0–36.0)
MCV: 96.3 fL (ref 80.0–100.0)
Monocytes Absolute: 0.5 10*3/uL (ref 0.1–1.0)
Monocytes Relative: 5 %
Neutro Abs: 6.2 10*3/uL (ref 1.7–7.7)
Neutrophils Relative %: 66 %
Platelets: 209 10*3/uL (ref 150–400)
RBC: 4.56 MIL/uL (ref 4.22–5.81)
RDW: 13.1 % (ref 11.5–15.5)
WBC: 9.3 10*3/uL (ref 4.0–10.5)
nRBC: 0 % (ref 0.0–0.2)

## 2024-01-15 LAB — URINALYSIS, W/ REFLEX TO CULTURE (INFECTION SUSPECTED)
Bacteria, UA: NONE SEEN
Bilirubin Urine: NEGATIVE
Glucose, UA: NEGATIVE mg/dL
Hgb urine dipstick: NEGATIVE
Ketones, ur: NEGATIVE mg/dL
Leukocytes,Ua: NEGATIVE
Nitrite: NEGATIVE
Protein, ur: NEGATIVE mg/dL
Specific Gravity, Urine: 1.005 (ref 1.005–1.030)
pH: 5 (ref 5.0–8.0)

## 2024-01-15 LAB — BRAIN NATRIURETIC PEPTIDE: B Natriuretic Peptide: 34.9 pg/mL (ref 0.0–100.0)

## 2024-01-15 LAB — RAPID URINE DRUG SCREEN, HOSP PERFORMED
Amphetamines: NOT DETECTED
Barbiturates: NOT DETECTED
Benzodiazepines: NOT DETECTED
Cocaine: POSITIVE — AB
Opiates: NOT DETECTED
Tetrahydrocannabinol: NOT DETECTED

## 2024-01-15 LAB — TROPONIN I (HIGH SENSITIVITY)
Troponin I (High Sensitivity): 17 ng/L (ref <18)
Troponin I (High Sensitivity): 17 ng/L (ref <18)

## 2024-01-15 LAB — I-STAT CG4 LACTIC ACID, ED
Lactic Acid, Venous: 1.9 mmol/L (ref 0.5–1.9)
Lactic Acid, Venous: 2.4 mmol/L (ref 0.5–1.9)

## 2024-01-15 LAB — MAGNESIUM: Magnesium: 2.3 mg/dL (ref 1.7–2.4)

## 2024-01-15 LAB — ETHANOL: Alcohol, Ethyl (B): 146 mg/dL — ABNORMAL HIGH (ref <10)

## 2024-01-15 LAB — CK: Total CK: 2122 U/L — ABNORMAL HIGH (ref 49–397)

## 2024-01-15 LAB — TSH: TSH: 0.653 u[IU]/mL (ref 0.350–4.500)

## 2024-01-15 SURGERY — LEFT HEART CATH AND CORONARY ANGIOGRAPHY
Anesthesia: LOCAL

## 2024-01-15 MED ORDER — SODIUM CHLORIDE 0.9 % IV BOLUS
1000.0000 mL | Freq: Once | INTRAVENOUS | Status: AC
  Administered 2024-01-15: 1000 mL via INTRAVENOUS

## 2024-01-15 NOTE — Discharge Instructions (Signed)
 Your history, exam, and workup today revealed you were very dehydrated and even went into rhabdomyolysis with your muscles.  As you were having chest pain earlier, we did a workup that showed normal heart enzymes and her EKG was similar to prior.  Cardiology did not feel you are having a heart attack when they saw you.  After rehydration, we feel you are safe for discharge home.  You needed to be here today for evaluation given how you look this morning.  We discussed possible admission for rehydration and he would like to go home.  Please follow-up with a primary doctor and increase your hydration.  Please return to the nearest Emergency Department if any symptoms were to change or worsen.

## 2024-01-15 NOTE — Progress Notes (Signed)
 Received page visited and then page was cancelled. Chaplain available as needed.  Venida Jarvis, [place], [facility], Pager [phone]

## 2024-01-15 NOTE — ED Triage Notes (Signed)
 Pt BIB by EMS reporting onset of sharp mid-sternal CP since 0800. Pt endorses dizziness and ETOH use. Pt states pain is resolved at this time. Denies SHOB, N/V. Evaluated by cardiology on arrival. No Code STEMI at this time.

## 2024-01-15 NOTE — ED Notes (Signed)
 Pt refuses to keep cardiac leads attached and use bedside commode. CCMD notified.

## 2024-01-15 NOTE — ED Provider Notes (Signed)
  EMERGENCY DEPARTMENT AT [facility] Provider Note   CSN: [id number] Arrival date & time: 01/15/24  4098     History  Chief Complaint  Patient presents with   Chest Pain   Dizziness    John Mayo is a 63 y.o. male.  The history is provided by the patient and medical records. No language interpreter was used.  Chest Pain Pain location:  Substernal area Pain quality: aching, crushing, pressure and sharp   Pain radiates to:  Does not radiate Pain severity:  Moderate Onset quality:  Unable to specify Timing:  Unable to specify Progression:  Resolved Chronicity:  New Relieved by:  Nothing Worsened by:  Nothing Ineffective treatments:  None tried Associated symptoms: diaphoresis, fatigue, near-syncope and syncope   Associated symptoms: no abdominal pain, no anxiety, no back pain, no cough, no dizziness, no fever, no headache, no lower extremity edema, no nausea, no numbness, no palpitations, no shortness of breath, no vomiting and no weakness   Dizziness Quality:  Lightheadedness Associated symptoms: chest pain and syncope   Associated symptoms: no diarrhea, no headaches, no nausea, no palpitations, no shortness of breath, no vomiting and no weakness        Home Medications Prior to Admission medications   Medication Sig Start Date End Date Taking? Authorizing Provider  amLODipine (NORVASC) 10 MG tablet Take 1 tablet (10 mg total) by mouth daily. 05/08/22 06/07/22  Alvira Monday, MD  cyclobenzaprine (FLEXERIL) 10 MG tablet Take 1 tablet (10 mg total) by mouth 2 (two) times daily as needed for muscle spasms. 11/06/19   Blane Ohara, MD  metFORMIN (GLUCOPHAGE) 500 MG tablet Take 1 tablet (500 mg total) by mouth 2 (two) times daily with a meal. 05/08/22 07/07/22  Alvira Monday, MD  ondansetron (ZOFRAN ODT) 4 MG disintegrating tablet Take 1 tablet (4 mg total) by mouth every 8 (eight) hours as needed for nausea or vomiting. 11/27/20   Antony Madura, PA-C       Allergies    Patient has no known allergies.    Review of Systems   Review of Systems  Constitutional:  Positive for diaphoresis and fatigue. Negative for chills and fever.  HENT:  Negative for congestion.   Eyes:  Negative for visual disturbance.  Respiratory:  Negative for cough, chest tightness, shortness of breath and wheezing.   Cardiovascular:  Positive for chest pain, syncope and near-syncope. Negative for palpitations and leg swelling.  Gastrointestinal:  Negative for abdominal pain, constipation, diarrhea, nausea and vomiting.  Genitourinary:  Negative for dysuria and frequency.  Musculoskeletal:  Negative for back pain, neck pain and neck stiffness.  Skin:  Negative for rash and wound.  Neurological:  Positive for light-headedness. Negative for dizziness, weakness, numbness and headaches.  Psychiatric/Behavioral:  Negative for agitation.   All other systems reviewed and are negative.   Physical Exam Updated Vital Signs BP (!) 171/89   Pulse 71   Temp 97.8 F (36.6 C)   Resp 16   SpO2 95%  Physical Exam Vitals and nursing note reviewed.  Constitutional:      General: He is not in acute distress.    Appearance: He is well-developed. He is not ill-appearing, toxic-appearing or diaphoretic.  HENT:     Head: Normocephalic and atraumatic.  Eyes:     Conjunctiva/sclera: Conjunctivae normal.     Pupils: Pupils are equal, round, and reactive to light.  Cardiovascular:     Rate and Rhythm: Normal rate and regular rhythm.  Heart sounds: Normal heart sounds. No murmur heard. Pulmonary:     Effort: Pulmonary effort is normal. No respiratory distress.     Breath sounds: Normal breath sounds. No decreased breath sounds, wheezing, rhonchi or rales.  Abdominal:     Palpations: Abdomen is soft.     Tenderness: There is no abdominal tenderness.  Musculoskeletal:        General: No swelling.     Cervical back: Neck supple.     Right lower leg: No tenderness. No  edema.     Left lower leg: No tenderness. No edema.  Skin:    General: Skin is warm and dry.     Capillary Refill: Capillary refill takes less than 2 seconds.     Findings: No erythema.  Neurological:     Mental Status: He is alert.     ED Results / Procedures / Treatments   Labs (all labs ordered are listed, but only abnormal results are displayed) Labs Reviewed  COMPREHENSIVE METABOLIC PANEL - Abnormal; Notable for the following components:      Result Value   CO2 18 (*)    Glucose, Bld 107 (*)    Calcium 8.7 (*)    AST 66 (*)    All other components within normal limits  CK - Abnormal; Notable for the following components:   Total CK 2,122 (*)    All other components within normal limits  URINALYSIS, W/ REFLEX TO CULTURE (INFECTION SUSPECTED) - Abnormal; Notable for the following components:   Color, Urine STRAW (*)    All other components within normal limits  RAPID URINE DRUG SCREEN, HOSP PERFORMED - Abnormal; Notable for the following components:   Cocaine POSITIVE (*)    All other components within normal limits  ETHANOL - Abnormal; Notable for the following components:   Alcohol, Ethyl (B) 146 (*)    All other components within normal limits  I-STAT CG4 LACTIC ACID, ED - Abnormal; Notable for the following components:   Lactic Acid, Venous 2.4 (*)    All other components within normal limits  CBC WITH DIFFERENTIAL/PLATELET  BRAIN NATRIURETIC PEPTIDE  TSH  MAGNESIUM  I-STAT CG4 LACTIC ACID, ED  TROPONIN I (HIGH SENSITIVITY)  TROPONIN I (HIGH SENSITIVITY)    EKG EKG Interpretation Date/Time:  Thursday January 15 2024 09:54:05 EDT Ventricular Rate:  72 PR Interval:  159 QRS Duration:  86 QT Interval:  400 QTC Calculation: 438 R Axis:   84  Text Interpretation: Sinus rhythm Atrial premature complexes Biatrial enlargement when compared to prior, similar appearance. No STEMI Confirmed by Theda Belfast ([id number]) on 01/15/2024 9:58:38 AM  Radiology DG Chest  Portable 1 View Result Date: 01/15/2024 CLINICAL DATA:  Chest pain. EXAM: PORTABLE CHEST 1 VIEW COMPARISON:  01/04/2024. FINDINGS: Bilateral lung fields are clear. Bilateral costophrenic angles are clear. Normal cardio-mediastinal silhouette. No acute osseous abnormalities. The soft tissues are within normal limits. IMPRESSION: No active disease. Electronically Signed   By: Jules Schick M.D.   On: 01/15/2024 10:38    Procedures Procedures    Medications Ordered in ED Medications  sodium chloride 0.9 % bolus 1,000 mL (0 mLs Intravenous Stopped 01/15/24 1401)    ED Course/ Medical Decision Making/ A&P                                 Medical Decision Making Amount and/or Complexity of Data Reviewed Labs: ordered. Radiology: ordered.  Courtney Fenlon is a 63 y.o. male with a past medical history significant for hypertension and diabetes who presents as a code STEMI for chest pain.  By the time I arrived to see the patient, cardiology had canceled the code STEMI.  According to patient, he was walking to [place] where he was.  Court this morning and thinks he "overdid it".  He does report he has been drinking and may have been "given something my drink".  He reports he drank several beers overnight.  He reports no trauma.  He reports that while walking he thought he was getting overheated and was very sweaty and had some chest pain and lightheadedness.  He reports he went to the ground but did not hit his head.  He is not any headache or neck pain.  He says that EMS was called and he was found on the ground and he says resting has made it better.  He denies any shortness of breath, nausea, vomiting, constipation, diarrhea, or urinary changes.  EMS reported that his chest pain was 7 out of 10 in central chest pressure when he first had it but now has resolved.  Patient keeps saying that he is ready to leave and needs to go to court.  Patient currently is denying chest pain.  He denies any  palpitations, leg pain, leg swelling, and denies any history of blood clots.  On my exam, patient is somewhat somnolent but then will wake up and answer questions.  He does seem somewhat intoxicated during conversation so we will check an EtOH.  His lungs were clear and chest was nontender.  Abdomen was nontender.  He was able to stand and ambulate to urinate earlier.  Legs are nontender and not critically edematous.  Patient is resting now.  No acute agitation.  Pupils are symmetric and reactive with normal extraocular movements.  EKG is similar to prior and do not see a clear STEMI.  Will get chest x-ray given the chest discomfort and lightheadedness/near syncope.  Will check EtOH level and give him some fluids.  Will check screening labs including a CK given his long walk and his fatigue and think he overdid it.  He reports feeling overheated.  Will check some other electrolytes including magnesium level and TSH given his near syncope to look for electrolyte disturbance to contribute to arrhythmia and this.  Anticipate reevaluation after workup is further along to determine disposition.  2:05 PM Patient's workup continues to return.  Troponin negative x 2.  Patient was positive for cocaine and his alcohol level was elevated.  His lactic acid was 1.9 and then went to 2.4.  Patient is able to eat and drink well and tolerated the fluids.  Suspect is related to dehydration.  BNP not elevated and electrolytes otherwise reassuring.  Calcium slightly low.  AST slightly elevated however I suspect this is also due to alcohol.  His CK was over 2000 and we discussed this being dehydration mild rhabdo as he was trying to walk to another city this morning.  We discussed admission for rehydration but patient says he would like to go home.  His CBC is normal and  he was able to ambulate to the bathroom back without any lightheadedness and is feeling much better after fluids.  His chest x-ray did not show pneumonia  and his EKG appeared similar to prior.  We went over all the finds together and he would like to go home.  Will give him follow-up with  the PCP and to increase his hydration.  He depressions or concerns and was discharged in stable condition.         Final Clinical Impression(s) / ED Diagnoses Final diagnoses:  Atypical chest pain  Lightheadedness  Non-traumatic rhabdomyolysis  Dehydration    Rx / DC Orders ED Discharge Orders     None      Clinical Impression: 1. Atypical chest pain   2. Lightheadedness   3. Non-traumatic rhabdomyolysis   4. Dehydration     Disposition: Discharge  Condition: Good  I have discussed the results, Dx and Tx plan with the pt(& family if present). He/she/they expressed understanding and agree(s) with the plan. Discharge instructions discussed at great length. Strict return precautions discussed and pt &/or family have verbalized understanding of the instructions. No further questions at time of discharge.    New Prescriptions   No medications on file    Follow Up: [facility] [address] [place] [address] [place] [address] [phone] Schedule an appointment as soon as possible for a visit    [facility] Emergency Department at [facility] [address] [place] [address] [phone]       Jakara Blatter, Canary Brim, MD 01/15/24 [phone]

## 2024-01-15 NOTE — ED Notes (Signed)
 CCMD called. Placed on cardiac monitoring.
